# Patient Record
Sex: Male | Born: 1995 | Race: White | Hispanic: No | Marital: Single | Smoking: Never smoker
Health system: Southern US, Community
[De-identification: ages and names within clinical notes are randomized; demographics above are authoritative.]

## PROBLEM LIST (undated history)

## (undated) HISTORY — PX: TESTICLE SURGERY: SHX794

---
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
## 2015-01-23 ENCOUNTER — Emergency Department (HOSPITAL_BASED_OUTPATIENT_CLINIC_OR_DEPARTMENT_OTHER): Payer: BLUE CROSS/BLUE SHIELD

## 2015-01-23 ENCOUNTER — Emergency Department (HOSPITAL_BASED_OUTPATIENT_CLINIC_OR_DEPARTMENT_OTHER)
Admission: EM | Admit: 2015-01-23 | Discharge: 2015-01-23 | Disposition: A | Discharge status: Home / Self Care | Payer: BLUE CROSS/BLUE SHIELD | Attending: Emergency Medicine | Admitting: Emergency Medicine

## 2015-01-23 ENCOUNTER — Encounter (HOSPITAL_BASED_OUTPATIENT_CLINIC_OR_DEPARTMENT_OTHER): Payer: Self-pay | Admitting: Emergency Medicine

## 2015-01-23 DIAGNOSIS — R05 Cough: Secondary | ICD-10-CM | POA: Insufficient documentation

## 2015-01-23 DIAGNOSIS — Z880 Allergy status to penicillin: Secondary | ICD-10-CM | POA: Insufficient documentation

## 2015-01-23 DIAGNOSIS — R059 Cough, unspecified: Secondary | ICD-10-CM

## 2015-01-23 MED ORDER — PREDNISONE 50 MG PO TABS: 50.0000 mg | ORAL_TABLET | Freq: Every day | ORAL | Status: DC

## 2015-01-23 MED ORDER — ALBUTEROL SULFATE HFA 108 (90 BASE) MCG/ACT IN AERS: 2.0000 | INHALATION_SPRAY | RESPIRATORY_TRACT | Status: DC | PRN

## 2015-01-23 MED ORDER — IPRATROPIUM-ALBUTEROL 0.5-2.5 (3) MG/3ML IN SOLN
3.0000 mL | Freq: Once | RESPIRATORY_TRACT | Status: AC
  Administered 2015-01-23: 3 mL via RESPIRATORY_TRACT
  Filled 2015-01-23: qty 3

## 2015-01-23 MED ORDER — PREDNISONE 50 MG PO TABS
60.0000 mg | ORAL_TABLET | Freq: Once | ORAL | Status: AC
  Administered 2015-01-23: 60 mg via ORAL
  Filled 2015-01-23 (×2): qty 1

## 2015-01-23 NOTE — ED Notes (Signed)
Cough congestion and chest tightness from coughing for 2 weeks.

## 2015-01-23 NOTE — ED Provider Notes (Signed)
CSN: [id number]     Arrival date & time 01/23/15  0038 History   First MD Initiated Contact with Patient 01/23/15 0048     Chief Complaint  Patient presents with  . Cough     (Consider location/radiation/quality/duration/timing/severity/associated sxs/prior Treatment) Patient is a 19 y.o. male presenting with cough. The history is provided by the patient.  Cough He has had a dry cough for the last 2 weeks. This is associated with a tight feeling in his chest. This evening, he had a dull, achy pain in the right sternal area with some radiation through to the back. Denies dyspnea, fever, chills. He denies arthralgias or myalgias. He has been taking Robitussin with slight relief. There is no tobacco smoke exposure.  History reviewed. No pertinent past medical history. Past Surgical History  Procedure Laterality Date  . Testicle surgery     History reviewed. No pertinent family history. Social History  Substance Use Topics  . Smoking status: Never Smoker   . Smokeless tobacco: None  . Alcohol Use: No    Review of Systems  Respiratory: Positive for cough.   All other systems reviewed and are negative.     Allergies  Penicillins  Home Medications   Prior to Admission medications   Not on File   BP 148/77 mmHg  Pulse 82  Temp(Src) 97.7 F (36.5 C) (Oral)  Resp 20  Ht 6' (1.829 m)  Wt 222 lb (100.699 kg)  BMI 30.10 kg/m2  SpO2 99% Physical Exam  Nursing note and vitals reviewed.  19 year old male, resting comfortably and in no acute distress. Vital signs are significant for hypertension. Oxygen saturation is 99%, which is normal. Head is normocephalic and atraumatic. PERRLA, EOMI. Oropharynx is clear. Neck is nontender and supple without adenopathy or JVD. Back is nontender and there is no CVA tenderness. Lungs are clear without rales, wheezes, or rhonchi. Prolonged exhalation phase is noted. Chest is mildly tender anteriorly. Heart has regular rate and rhythm with  2/6 systolic ejection murmur heard along the left sternal border. Abdomen is soft, flat, nontender without masses or hepatosplenomegaly and peristalsis is normoactive. Extremities have no cyanosis or edema, full range of motion is present. Skin is warm and dry without rash. Neurologic: Mental status is normal, cranial nerves are intact, there are no motor or sensory deficits.  ED Course  Procedures (including critical care time)  Imaging Review Dg Chest 2 View  01/23/2015   CLINICAL DATA:  Acute onset of mid chest tightness. Initial encounter.  EXAM: CHEST  2 VIEW  COMPARISON:  None.  FINDINGS: The lungs are well-aerated and clear. There is no evidence of focal opacification, pleural effusion or pneumothorax.  The heart is normal in size; the mediastinal contour is within normal limits. No acute osseous abnormalities are seen.  IMPRESSION: No acute cardiopulmonary process seen.   Electronically Signed   By: Roanna Raider M.D.   On: 01/23/2015 01:35   I have personally reviewed and evaluated these images as part of my medical decision-making.   MDM   Final diagnoses:  Cough    Acute bronchitis which may be viral and may be allergic. Chest x-ray or be obtained in a beginning therapeutic trial of albuterol with ipratropium.  Chest x-ray is unremarkable. He had excellent relief of symptoms with albuterol with ipratropium. He is discharged with prescription for albuterol inhaler and prednisone. Initial dose of prednisone given in the ED. I suspect that his symptoms are allergic in nature. He will  need to have further evaluation for possible allergies. With his PCP.  Dione Booze, MD 01/23/15 [phone]

## 2015-01-23 NOTE — Discharge Instructions (Signed)
Cough, Adult Coughing is a reflex that clears your throat and your airways. Coughing helps to heal and protect your lungs. It is normal to cough occasionally, but a cough that happens with other symptoms or lasts a long time may be a sign of a condition that needs treatment. A cough may last only 2-3 weeks (acute), or it may last longer than 8 weeks (chronic). CAUSES Coughing is commonly caused by:  Breathing in substances that irritate your lungs.  A viral or bacterial respiratory infection.  Allergies.  Asthma.  Postnasal drip.  Smoking.  Acid backing up from the stomach into the esophagus (gastroesophageal reflux).  Certain medicines.  Chronic lung problems, including COPD (or rarely, lung cancer).  Other medical conditions such as heart failure. HOME CARE INSTRUCTIONS  Pay attention to any changes in your symptoms. Take these actions to help with your discomfort:  Take medicines only as told by your health care provider.  If you were prescribed an antibiotic medicine, take it as told by your health care provider. Do not stop taking the antibiotic even if you start to feel better.  Talk with your health care provider before you take a cough suppressant medicine.  Drink enough fluid to keep your urine clear or pale yellow.  If the air is dry, use a cold steam vaporizer or humidifier in your bedroom or your home to help loosen secretions.  Avoid anything that causes you to cough at work or at home.  If your cough is worse at night, try sleeping in a semi-upright position.  Avoid cigarette smoke. If you smoke, quit smoking. If you need help quitting, ask your health care provider.  Avoid caffeine.  Avoid alcohol.  Rest as needed. SEEK MEDICAL CARE IF:   You have new symptoms.  You cough up pus.  Your cough does not get better after 2-3 weeks, or your cough gets worse.  You cannot control your cough with suppressant medicines and you are losing sleep.  You  develop pain that is getting worse or pain that is not controlled with pain medicines.  You have a fever.  You have unexplained weight loss.  You have night sweats. SEEK IMMEDIATE MEDICAL CARE IF:  You cough up blood.  You have difficulty breathing.  Your heartbeat is very fast.   This information is not intended to replace advice given to you by your health care provider. Make sure you discuss any questions you have with your health care provider.   Document Released: 10/01/2010 Document Revised: 12/24/2014 Document Reviewed: 06/11/2014 Elsevier Interactive Patient Education 2016 [employer].   Allergies An allergy is an abnormal reaction to a substance by the body's defense system (immune system). Allergies can develop at any age. WHAT CAUSES ALLERGIES? An allergic reaction happens when the immune system mistakenly reacts to a normally harmless substance, called an allergen, as if it were harmful. The immune system releases antibodies to fight the substance. Antibodies eventually release a chemical called histamine into the bloodstream. The release of histamine is meant to protect the body from infection, but it also causes discomfort. An allergic reaction can be triggered by:  Eating an allergen.  Inhaling an allergen.  Touching an allergen. WHAT TYPES OF ALLERGIES ARE THERE? There are many types of allergies. Common types include:  Seasonal allergies. People with this type of allergy are usually allergic to substances that are only present during certain seasons, such as molds and pollens.  Food allergies.  Drug allergies.  Insect allergies.  Animal dander allergies. WHAT ARE SYMPTOMS OF ALLERGIES? Possible allergy symptoms include:  Swelling of the lips, face, tongue, mouth, or throat.  Sneezing, coughing, or wheezing.  Nasal congestion.  Tingling in the mouth.  Rash.  Itching.  Itchy, red, swollen areas of skin (hives).  Watery  eyes.  Vomiting.  Diarrhea.  Dizziness.  Lightheadedness.  Fainting.  Trouble breathing or swallowing.  Chest tightness.  Rapid heartbeat. HOW ARE ALLERGIES DIAGNOSED? Allergies are diagnosed with a medical and family history and one or more of the following:  Skin tests.  Blood tests.  A food diary. A food diary is a record of all the foods and drinks you have in a day and of all the symptoms you experience.  The results of an elimination diet. An elimination diet involves eliminating foods from your diet and then adding them back in one by one to find out if a certain food causes an allergic reaction. HOW ARE ALLERGIES TREATED? There is no cure for allergies, but allergic reactions can be treated with medicine. Severe reactions usually need to be treated at a hospital. HOW CAN REACTIONS BE PREVENTED? The best way to prevent an allergic reaction is by avoiding the substance you are allergic to. Allergy shots and medicines can also help prevent reactions in some cases. People with severe allergic reactions may be able to prevent a life-threatening reaction called anaphylaxis with a medicine given right after exposure to the allergen.   This information is not intended to replace advice given to you by your health care provider. Make sure you discuss any questions you have with your health care provider.   Document Released: 06/28/2002 Document Revised: 04/25/2014 Document Reviewed: 01/14/2014 Elsevier Interactive Patient Education 2016 Elsevier Inc.  Albuterol inhalation aerosol What is this medicine? ALBUTEROL (al Gaspar Bidding) is a bronchodilator. It helps open up the airways in your lungs to make it easier to breathe. This medicine is used to treat and to prevent bronchospasm. This medicine may be used for other purposes; ask your health care provider or pharmacist if you have questions. What should I tell my health care provider before I take this medicine? They need to  know if you have any of the following conditions: -diabetes -heart disease or irregular heartbeat -high blood pressure -pheochromocytoma -seizures -thyroid disease -an unusual or allergic reaction to albuterol, levalbuterol, sulfites, other medicines, foods, dyes, or preservatives -pregnant or trying to get pregnant -breast-feeding How should I use this medicine? This medicine is for inhalation through the mouth. Follow the directions on your prescription label. Take your medicine at regular intervals. Do not use more often than directed. Make sure that you are using your inhaler correctly. Ask you doctor or health care provider if you have any questions. Talk to your pediatrician regarding the use of this medicine in children. Special care may be needed. Overdosage: If you think you have taken too much of this medicine contact a poison control center or emergency room at once. NOTE: This medicine is only for you. Do not share this medicine with others. What if I miss a dose? If you miss a dose, use it as soon as you can. If it is almost time for your next dose, use only that dose. Do not use double or extra doses. What may interact with this medicine? -anti-infectives like chloroquine and pentamidine -caffeine -cisapride -diuretics -medicines for colds -medicines for depression or for emotional or psychotic conditions -medicines for weight loss including some herbal products -methadone -  some antibiotics like clarithromycin, erythromycin, levofloxacin, and linezolid -some heart medicines -steroid hormones like dexamethasone, cortisone, hydrocortisone -theophylline -thyroid hormones This list may not describe all possible interactions. Give your health care provider a list of all the medicines, herbs, non-prescription drugs, or dietary supplements you use. Also tell them if you smoke, drink alcohol, or use illegal drugs. Some items may interact with your medicine. What should I watch  for while using this medicine? Tell your doctor or health care professional if your symptoms do not improve. Do not use extra albuterol. If your asthma or bronchitis gets worse while you are using this medicine, call your doctor right away. If your mouth gets dry try chewing sugarless gum or sucking hard candy. Drink water as directed. What side effects may I notice from receiving this medicine? Side effects that you should report to your doctor or health care professional as soon as possible: -allergic reactions like skin rash, itching or hives, swelling of the face, lips, or tongue -breathing problems -chest pain -feeling faint or lightheaded, falls -high blood pressure -irregular heartbeat -fever -muscle cramps or weakness -pain, tingling, numbness in the hands or feet -vomiting Side effects that usually do not require medical attention (report to your doctor or health care professional if they continue or are bothersome): -cough -difficulty sleeping -headache -nervousness or trembling -stomach upset -stuffy or runny nose -throat irritation -unusual taste This list may not describe all possible side effects. Call your doctor for medical advice about side effects. You may report side effects to FDA at 1-800-FDA-1088. Where should I keep my medicine? Keep out of the reach of children. Store at room temperature between 15 and 30 degrees C (59 and 86 degrees F). The contents are under pressure and may burst when exposed to heat or flame. Do not freeze. This medicine does not work as well if it is too cold. Throw away any unused medicine after the expiration date. Inhalers need to be thrown away after the labeled number of puffs have been used or by the expiration date; whichever comes first. Ventolin HFA should be thrown away 12 months after removing from foil pouch. Check the instructions that come with your medicine. NOTE: This sheet is a summary. It may not cover all possible  information. If you have questions about this medicine, talk to your doctor, pharmacist, or health care provider.    2016, Elsevier/Gold Standard. (2012-09-20 10:57:17)  Prednisone tablets What is this medicine? PREDNISONE (PRED ni sone) is a corticosteroid. It is commonly used to treat inflammation of the skin, joints, lungs, and other organs. Common conditions treated include asthma, allergies, and arthritis. It is also used for other conditions, such as blood disorders and diseases of the adrenal glands. This medicine may be used for other purposes; ask your health care provider or pharmacist if you have questions. What should I tell my health care provider before I take this medicine? They need to know if you have any of these conditions: -Cushing's syndrome -diabetes -glaucoma -heart disease -high blood pressure -infection (especially a virus infection such as chickenpox, cold sores, or herpes) -kidney disease -liver disease -mental illness -myasthenia gravis -osteoporosis -seizures -stomach or intestine problems -thyroid disease -an unusual or allergic reaction to lactose, prednisone, other medicines, foods, dyes, or preservatives -pregnant or trying to get pregnant -breast-feeding How should I use this medicine? Take this medicine by mouth with a glass of water. Follow the directions on the prescription label. Take this medicine with food. If you are  taking this medicine once a day, take it in the morning. Do not take more medicine than you are told to take. Do not suddenly stop taking your medicine because you may develop a severe reaction. Your doctor will tell you how much medicine to take. If your doctor wants you to stop the medicine, the dose may be slowly lowered over time to avoid any side effects. Talk to your pediatrician regarding the use of this medicine in children. Special care may be needed. Overdosage: If you think you have taken too much of this medicine contact  a poison control center or emergency room at once. NOTE: This medicine is only for you. Do not share this medicine with others. What if I miss a dose? If you miss a dose, take it as soon as you can. If it is almost time for your next dose, talk to your doctor or health care professional. You may need to miss a dose or take an extra dose. Do not take double or extra doses without advice. What may interact with this medicine? Do not take this medicine with any of the following medications: -metyrapone -mifepristone This medicine may also interact with the following medications: -aminoglutethimide -amphotericin B -aspirin and aspirin-like medicines -barbiturates -certain medicines for diabetes, like glipizide or glyburide -cholestyramine -cholinesterase inhibitors -cyclosporine -digoxin -diuretics -ephedrine -male hormones, like estrogens and birth control pills -isoniazid -ketoconazole -NSAIDS, medicines for pain and inflammation, like ibuprofen or naproxen -phenytoin -rifampin -toxoids -vaccines -warfarin This list may not describe all possible interactions. Give your health care provider a list of all the medicines, herbs, non-prescription drugs, or dietary supplements you use. Also tell them if you smoke, drink alcohol, or use illegal drugs. Some items may interact with your medicine. What should I watch for while using this medicine? Visit your doctor or health care professional for regular checks on your progress. If you are taking this medicine over a prolonged period, carry an identification card with your name and address, the type and dose of your medicine, and your doctor's name and address. This medicine may increase your risk of getting an infection. Tell your doctor or health care professional if you are around anyone with measles or chickenpox, or if you develop sores or blisters that do not heal properly. If you are going to have surgery, tell your doctor or health  care professional that you have taken this medicine within the last twelve months. Ask your doctor or health care professional about your diet. You may need to lower the amount of salt you eat. This medicine may affect blood sugar levels. If you have diabetes, check with your doctor or health care professional before you change your diet or the dose of your diabetic medicine. What side effects may I notice from receiving this medicine? Side effects that you should report to your doctor or health care professional as soon as possible: -allergic reactions like skin rash, itching or hives, swelling of the face, lips, or tongue -changes in emotions or moods -changes in vision -depressed mood -eye pain -fever or chills, cough, sore throat, pain or difficulty passing urine -increased thirst -swelling of ankles, feet Side effects that usually do not require medical attention (report to your doctor or health care professional if they continue or are bothersome): -confusion, excitement, restlessness -headache -nausea, vomiting -skin problems, acne, thin and shiny skin -trouble sleeping -weight gain This list may not describe all possible side effects. Call your doctor for medical advice about side effects. You  may report side effects to FDA at 1-800-FDA-1088. Where should I keep my medicine? Keep out of the reach of children. Store at room temperature between 15 and 30 degrees C (59 and 86 degrees F). Protect from light. Keep container tightly closed. Throw away any unused medicine after the expiration date. NOTE: This sheet is a summary. It may not cover all possible information. If you have questions about this medicine, talk to your doctor, pharmacist, or health care provider.    2016, Elsevier/Gold Standard. (2010-11-18 10:57:14)

## 2018-11-01 ENCOUNTER — Emergency Department (HOSPITAL_BASED_OUTPATIENT_CLINIC_OR_DEPARTMENT_OTHER)
Admission: EM | Admit: 2018-11-01 | Discharge: 2018-11-01 | Disposition: A | Discharge status: Home / Self Care | Payer: Federal, State, Local not specified - PPO | Attending: Emergency Medicine | Admitting: Emergency Medicine

## 2018-11-01 ENCOUNTER — Other Ambulatory Visit: Payer: Self-pay

## 2018-11-01 ENCOUNTER — Encounter (HOSPITAL_BASED_OUTPATIENT_CLINIC_OR_DEPARTMENT_OTHER): Payer: Self-pay | Admitting: Emergency Medicine

## 2018-11-01 DIAGNOSIS — Z20828 Contact with and (suspected) exposure to other viral communicable diseases: Secondary | ICD-10-CM | POA: Insufficient documentation

## 2018-11-01 DIAGNOSIS — R1032 Left lower quadrant pain: Secondary | ICD-10-CM | POA: Insufficient documentation

## 2018-11-01 DIAGNOSIS — R109 Unspecified abdominal pain: Secondary | ICD-10-CM | POA: Diagnosis present

## 2018-11-01 DIAGNOSIS — R1012 Left upper quadrant pain: Secondary | ICD-10-CM | POA: Diagnosis not present

## 2018-11-01 LAB — CBC WITH DIFFERENTIAL/PLATELET
Abs Immature Granulocytes: 0.02 10*3/uL (ref 0.00–0.07)
Basophils Absolute: 0.1 10*3/uL (ref 0.0–0.1)
Basophils Relative: 1 %
Eosinophils Absolute: 0.1 10*3/uL (ref 0.0–0.5)
Eosinophils Relative: 2 %
HCT: 39.3 % (ref 39.0–52.0)
Hemoglobin: 13.2 g/dL (ref 13.0–17.0)
Immature Granulocytes: 0 %
Lymphocytes Relative: 28 %
Lymphs Abs: 1.9 10*3/uL (ref 0.7–4.0)
MCH: 30.2 pg (ref 26.0–34.0)
MCHC: 33.6 g/dL (ref 30.0–36.0)
MCV: 89.9 fL (ref 80.0–100.0)
Monocytes Absolute: 0.6 10*3/uL (ref 0.1–1.0)
Monocytes Relative: 9 %
Neutro Abs: 3.9 10*3/uL (ref 1.7–7.7)
Neutrophils Relative %: 60 %
Platelets: 288 10*3/uL (ref 150–400)
RBC: 4.37 MIL/uL (ref 4.22–5.81)
RDW: 12.1 % (ref 11.5–15.5)
WBC: 6.6 10*3/uL (ref 4.0–10.5)
nRBC: 0 % (ref 0.0–0.2)

## 2018-11-01 LAB — URINALYSIS, MICROSCOPIC (REFLEX)

## 2018-11-01 LAB — COMPREHENSIVE METABOLIC PANEL
ALT: 11 U/L (ref 0–44)
AST: 16 U/L (ref 15–41)
Albumin: 3.8 g/dL (ref 3.5–5.0)
Alkaline Phosphatase: 44 U/L (ref 38–126)
Anion gap: 8 (ref 5–15)
BUN: 15 mg/dL (ref 6–20)
CO2: 26 mmol/L (ref 22–32)
Calcium: 8.3 mg/dL — ABNORMAL LOW (ref 8.9–10.3)
Chloride: 106 mmol/L (ref 98–111)
Creatinine, Ser: 0.85 mg/dL (ref 0.61–1.24)
GFR calc Af Amer: >60 mL/min (ref >60)
GFR calc non Af Amer: >60 mL/min (ref >60)
Glucose, Bld: 102 mg/dL — ABNORMAL HIGH (ref 70–99)
Potassium: 3.4 mmol/L — ABNORMAL LOW (ref 3.5–5.1)
Sodium: 140 mmol/L (ref 135–145)
Total Bilirubin: 0.6 mg/dL (ref 0.3–1.2)
Total Protein: 6.5 g/dL (ref 6.5–8.1)

## 2018-11-01 LAB — URINALYSIS, ROUTINE W REFLEX MICROSCOPIC
Bilirubin Urine: NEGATIVE
Glucose, UA: NEGATIVE mg/dL
Ketones, ur: NEGATIVE mg/dL
Leukocytes,Ua: NEGATIVE
Nitrite: NEGATIVE
Protein, ur: NEGATIVE mg/dL
Specific Gravity, Urine: >1.03 — ABNORMAL HIGH (ref 1.005–1.030)
pH: 6 (ref 5.0–8.0)

## 2018-11-01 LAB — LIPASE, BLOOD: Lipase: 25 U/L (ref 11–51)

## 2018-11-01 MED ORDER — LIDOCAINE VISCOUS HCL 2 % MT SOLN
15.0000 mL | Freq: Once | OROMUCOSAL | Status: AC
  Administered 2018-11-01: 15 mL via ORAL
  Filled 2018-11-01: qty 15

## 2018-11-01 MED ORDER — POTASSIUM CHLORIDE CRYS ER 20 MEQ PO TBCR
40.0000 meq | EXTENDED_RELEASE_TABLET | Freq: Once | ORAL | Status: AC
  Administered 2018-11-01: 40 meq via ORAL
  Filled 2018-11-01: qty 2

## 2018-11-01 MED ORDER — FAMOTIDINE 20 MG PO TABS: 20.0000 mg | ORAL_TABLET | Freq: Two times a day (BID) | ORAL | 0 refills | Status: DC

## 2018-11-01 MED ORDER — PANTOPRAZOLE SODIUM 40 MG PO TBEC: 40.0000 mg | DELAYED_RELEASE_TABLET | Freq: Every day | ORAL | 0 refills | Status: AC

## 2018-11-01 MED ORDER — ALUM & MAG HYDROXIDE-SIMETH 200-200-20 MG/5ML PO SUSP
30.0000 mL | Freq: Once | ORAL | Status: AC
  Administered 2018-11-01: 30 mL via ORAL
  Filled 2018-11-01: qty 30

## 2018-11-01 NOTE — ED Notes (Signed)
ED Provider at bedside. 

## 2018-11-01 NOTE — ED Provider Notes (Signed)
[facility] EMERGENCY DEPARTMENT Provider Note   CSN: [id number] Arrival date & time: 11/01/18  [id number]    History   Chief Complaint Chief Complaint  Patient presents with   Abdominal Pain    HPI Mario Cruz is a 23 y.o. male.     HPI  23 year old male with no significant past medical history presents with left-sided abdominal pain.  It has been present for about 2-4 weeks.  Worse over the last 1 week.  He noticed the pain was worse about 45 minutes after eating dinner last night.  Otherwise he has not been paying attention to know whether or not it is worse after eating typically.  It was at first coming and going but now is more constant.  Pepto-Bismol seems to temporarily help.  No vomiting, and constipation, or urinary symptoms.  He has noticed some diarrhea.  Pain currently is about a 4 out of 10.  History reviewed. No pertinent past medical history.  There are no active problems to display for this patient.   Past Surgical History:  Procedure Laterality Date   TESTICLE SURGERY          Home Medications    Prior to Admission medications   Medication Sig Start Date End Date Taking? Authorizing Provider  albuterol (PROVENTIL HFA;VENTOLIN HFA) 108 (90 BASE) MCG/ACT inhaler Inhale 2 puffs into the lungs every 4 (four) hours as needed for wheezing or shortness of breath (or coughing). 01/23/15   Dione BoozeGlick, David, MD  famotidine (PEPCID) 20 MG tablet Take 1 tablet (20 mg total) by mouth 2 (two) times daily. 11/01/18   Pricilla LovelessGoldston, Corvin Sorbo, MD  pantoprazole (PROTONIX) 40 MG tablet Take 1 tablet (40 mg total) by mouth daily. 11/01/18   Pricilla LovelessGoldston, Elzy Tomasello, MD  predniSONE (DELTASONE) 50 MG tablet Take 1 tablet (50 mg total) by mouth daily. 01/23/15   Dione BoozeGlick, David, MD    Family History History reviewed. No pertinent family history.  Social History Social History   Tobacco Use   Smoking status: Never Smoker   Smokeless tobacco: Never Used  Substance Use Topics    Alcohol use: No   Drug use: Not on file     Allergies   Penicillins   Review of Systems Review of Systems  Constitutional: Negative for fever.  Gastrointestinal: Positive for abdominal pain, diarrhea and nausea. Negative for vomiting.  Genitourinary: Negative for dysuria and hematuria.  All other systems reviewed and are negative.    Physical Exam Updated Vital Signs BP 129/81 (BP Location: Right Arm)    Pulse 66    Temp 98.1 F (36.7 C) (Oral)    Resp 18    Ht 6' (1.829 m)    Wt 77.1 kg    SpO2 100%    BMI 23.06 kg/m   Physical Exam Vitals signs and nursing note reviewed.  Constitutional:      General: He is not in acute distress.    Appearance: He is well-developed. He is not ill-appearing or diaphoretic.  HENT:     Head: Normocephalic and atraumatic.     Right Ear: External ear normal.     Left Ear: External ear normal.     Nose: Nose normal.  Eyes:     General:        Right eye: No discharge.        Left eye: No discharge.  Neck:     Musculoskeletal: Neck supple.  Cardiovascular:     Rate and Rhythm: Normal rate and regular rhythm.  Heart sounds: Normal heart sounds.  Pulmonary:     Effort: Pulmonary effort is normal.     Breath sounds: Normal breath sounds.  Abdominal:     Palpations: Abdomen is soft.     Tenderness: There is abdominal tenderness (mild) in the left upper quadrant.  Skin:    General: Skin is warm and dry.  Neurological:     Mental Status: He is alert.  Psychiatric:        Mood and Affect: Mood is not anxious.      ED Treatments / Results  Labs (all labs ordered are listed, but only abnormal results are displayed) Labs Reviewed  COMPREHENSIVE METABOLIC PANEL - Abnormal; Notable for the following components:      Result Value   Potassium 3.4 (*)    Glucose, Bld 102 (*)    Calcium 8.3 (*)    All other components within normal limits  URINALYSIS, ROUTINE W REFLEX MICROSCOPIC - Abnormal; Notable for the following components:    Specific Gravity, Urine >1.030 (*)    Hgb urine dipstick TRACE (*)    All other components within normal limits  URINALYSIS, MICROSCOPIC (REFLEX) - Abnormal; Notable for the following components:   Bacteria, UA FEW (*)    All other components within normal limits  NOVEL CORONAVIRUS, NAA (HOSPITAL ORDER, SEND-OUT TO REF LAB)  CBC WITH DIFFERENTIAL/PLATELET  LIPASE, BLOOD    EKG None  Radiology No results found.  Procedures Procedures (including critical care time)  Medications Ordered in ED Medications  potassium chloride SA (K-DUR) CR tablet 40 mEq (has no administration in time range)  alum & mag hydroxide-simeth (MAALOX/MYLANTA) 200-200-20 MG/5ML suspension 30 mL (30 mLs Oral Given 11/01/18 0739)    And  lidocaine (XYLOCAINE) 2 % viscous mouth solution 15 mL (15 mLs Oral Given 11/01/18 0739)     Initial Impression / Assessment and Plan / ED Course  I have reviewed the triage vital signs and the nursing notes.  Pertinent labs & imaging results that were available during my care of the patient were reviewed by me and considered in my medical decision making (see chart for details).        Patient has minimal to no abdominal tenderness on exam.  He states there is pain in his left upper quadrant but sometimes pain in his left lower quadrant.  No flank or back pain.  Screening lab work is pretty benign.  There is trace hemoglobin on the urinalysis but no RBCs and is hard to know if this is true or significant.  I offered to do CT but through shared decision making we decided to hold off given that it is unlikely to immediately change management and he is unlikely to have a significant, emergent condition.  I still think this is probably upper GI such as gastritis.  We discussed we will try med such as PPI and H2 blocker and if these are not helping or if at any point he gets worse he should return here and we may pursue more advanced imaging.  Otherwise, we discussed return  precautions  He is also requesting a test for the novel coronavirus given a coworker had it and is not sure if he was exposed.  I discussed the limitations of this type of testing given he is asymptomatic but will send an outpatient test.  Nasir Bright was evaluated in Emergency Department on 11/01/2018 for the symptoms described in the history of present illness. He was evaluated in the context  of the global COVID-19 pandemic, which necessitated consideration that the patient might be at risk for infection with the SARS-CoV-2 virus that causes COVID-19. Institutional protocols and algorithms that pertain to the evaluation of patients at risk for COVID-19 are in a state of rapid change based on information released by regulatory bodies including the CDC and federal and state organizations. These policies and algorithms were followed during the patient's care in the ED.   Final Clinical Impressions(s) / ED Diagnoses   Final diagnoses:  Left sided abdominal pain    ED Discharge Orders         Ordered    pantoprazole (PROTONIX) 40 MG tablet  Daily     11/01/18 0810    famotidine (PEPCID) 20 MG tablet  2 times daily     11/01/18 0810           Pricilla LovelessGoldston, Musette Kisamore, MD 11/01/18 [phone]

## 2018-11-01 NOTE — ED Triage Notes (Signed)
Patient presents with complaints of left upper quadrant pain with nausea; states intermittent x 2-4 weeks; states taking pepto otc at home.

## 2018-11-01 NOTE — Discharge Instructions (Addendum)
If you develop worsening, continued, or recurrent abdominal pain, uncontrolled vomiting, fever, chest or back pain, or any other new/concerning symptoms then return to the ER for evaluation.   You most likely have stomach acid irritation or an ulcer.  Avoid things such as spicy food, alcohol, ibuprofen or other NSAIDs.  Return immediately if you develop vomiting or especially vomiting with blood.

## 2018-11-02 LAB — NOVEL CORONAVIRUS, NAA (HOSP ORDER, SEND-OUT TO REF LAB; TAT 18-24 HRS): SARS-CoV-2, NAA: NOT DETECTED

## 2020-07-29 ENCOUNTER — Other Ambulatory Visit: Payer: Self-pay

## 2020-07-29 ENCOUNTER — Emergency Department (INDEPENDENT_AMBULATORY_CARE_PROVIDER_SITE_OTHER): Payer: BC Managed Care – PPO

## 2020-07-29 ENCOUNTER — Emergency Department (INDEPENDENT_AMBULATORY_CARE_PROVIDER_SITE_OTHER)
Admission: RE | Admit: 2020-07-29 | Discharge: 2020-07-29 | Disposition: A | Discharge status: Home / Self Care | Payer: BC Managed Care – PPO | Source: Ambulatory Visit | Attending: Family Medicine | Admitting: Family Medicine

## 2020-07-29 VITALS — BP 138/81 | HR 72 | Temp 98.6°F | Resp 16

## 2020-07-29 DIAGNOSIS — R059 Cough, unspecified: Secondary | ICD-10-CM | POA: Diagnosis not present

## 2020-07-29 DIAGNOSIS — R058 Other specified cough: Secondary | ICD-10-CM | POA: Diagnosis not present

## 2020-07-29 MED ORDER — BENZONATATE 200 MG PO CAPS: 200.0000 mg | ORAL_CAPSULE | Freq: Two times a day (BID) | ORAL | 0 refills | Status: AC | PRN

## 2020-07-29 MED ORDER — PREDNISONE 20 MG PO TABS: 20.0000 mg | ORAL_TABLET | Freq: Two times a day (BID) | ORAL | 0 refills | Status: AC

## 2020-07-29 NOTE — ED Provider Notes (Signed)
[facility]    CSN: [id number] Arrival date & time: 07/29/20  1145      History   Chief Complaint Chief Complaint  Patient presents with  . Appointment    12:00  . Cough    HPI Mario Cruz is a 25 y.o. male.   HPI  Patient is here for chronic cough.  To dry cough.  Is been present for about 4 weeks.  He saw his primary care doctor.  He was given medicine for GERD.  He was given medicine for allergies.  Currently taking pantoprazole and Pepcid,, Zyrtec, and Flonase.  He states he tried these for 5 days.  He states that he has not gotten better.  He did not have any illness to start with, he does not know that he had any Covid or bronchitis.  He has had no fever or chills.  No underlying asthma or lung disease.  History reviewed. No pertinent past medical history.  There are no problems to display for this patient.   Past Surgical History:  Procedure Laterality Date  . TESTICLE SURGERY         Home Medications    Prior to Admission medications   Medication Sig Start Date End Date Taking? Authorizing Provider  benzonatate (TESSALON) 200 MG capsule Take 1 capsule (200 mg total) by mouth 2 (two) times daily as needed for cough. 07/29/20  Yes Eustace Moore, MD  predniSONE (DELTASONE) 20 MG tablet Take 1 tablet (20 mg total) by mouth 2 (two) times daily with a meal. 07/29/20  Yes Eustace Moore, MD  pantoprazole (PROTONIX) 40 MG tablet Take 1 tablet (40 mg total) by mouth daily. 11/01/18   Pricilla Loveless, MD  albuterol (PROVENTIL HFA;VENTOLIN HFA) 108 (90 BASE) MCG/ACT inhaler Inhale 2 puffs into the lungs every 4 (four) hours as needed for wheezing or shortness of breath (or coughing). 01/23/15 07/29/20  Dione Booze, MD  famotidine (PEPCID) 20 MG tablet Take 1 tablet (20 mg total) by mouth 2 (two) times daily. 11/01/18 07/29/20  Pricilla Loveless, MD    Family History History reviewed. No pertinent family history.  Social History Social History    Tobacco Use  . Smoking status: Never Smoker  . Smokeless tobacco: Never Used  Substance Use Topics  . Alcohol use: No     Allergies   Penicillins   Review of Systems Review of Systems See HPI  Physical Exam Triage Vital Signs ED Triage Vitals  Enc Vitals Group     BP 07/29/20 1202 138/81     Pulse Rate 07/29/20 1202 72     Resp 07/29/20 1202 16     Temp 07/29/20 1202 98.6 F (37 C)     Temp Source 07/29/20 1202 Oral     SpO2 07/29/20 1202 92 %     Weight --      Height --      Head Circumference --      Peak Flow --      Pain Score 07/29/20 1200 4     Pain Loc --      Pain Edu? --      Excl. in GC? --    No data found.  Updated Vital Signs BP 138/81 (BP Location: Left Arm)   Pulse 72   Temp 98.6 F (37 C) (Oral)   Resp 16   SpO2 92%     Physical Exam Constitutional:      General: He is not in acute distress.  Appearance: He is well-developed.  HENT:     Head: Normocephalic and atraumatic.     Right Ear: Tympanic membrane and ear canal normal.     Left Ear: Tympanic membrane and ear canal normal.     Nose: Rhinorrhea present.     Mouth/Throat:     Mouth: Mucous membranes are dry.     Pharynx: No posterior oropharyngeal erythema.  Eyes:     Conjunctiva/sclera: Conjunctivae normal.     Pupils: Pupils are equal, round, and reactive to light.  Cardiovascular:     Rate and Rhythm: Normal rate and regular rhythm.     Heart sounds: Normal heart sounds.  Pulmonary:     Effort: Pulmonary effort is normal. No respiratory distress.     Breath sounds: Normal breath sounds.  Abdominal:     General: There is no distension.     Palpations: Abdomen is soft.  Musculoskeletal:        General: Normal range of motion.     Cervical back: Normal range of motion.  Skin:    General: Skin is warm and dry.  Neurological:     Mental Status: He is alert.  Psychiatric:        Behavior: Behavior normal.      UC Treatments / Results  Labs (all labs ordered  are listed, but only abnormal results are displayed) Labs Reviewed - No data to display  EKG   Radiology DG Chest 2 View  Result Date: 07/29/2020 CLINICAL DATA:  Dry cough for 3-4 weeks. EXAM: CHEST - 2 VIEW COMPARISON:  PA and lateral chest 01/23/2015. FINDINGS: Lungs clear. Heart size normal. No pneumothorax or pleural fluid. No bony abnormality. IMPRESSION: Normal chest. Electronically Signed   By: Drusilla Kanner M.D.   On: 07/29/2020 13:13    Procedures Procedures (including critical care time)  Medications Ordered in UC Medications - No data to display  Initial Impression / Assessment and Plan / UC Course  I have reviewed the triage vital signs and the nursing notes.  Pertinent labs & imaging results that were available during my care of the patient were reviewed by me and considered in my medical decision making (see chart for details).     See HPI Final Clinical Impressions(s) / UC Diagnoses   Final diagnoses:  None     Discharge Instructions     Your chest x-ray is normal I am prescribing medication to help with the cough.  First, take prednisone 2 times a day for 5 days.  This will help with any inflammation in your lungs.  Take your first dose when you get the prescription, and your second dose with supper.  After this take twice a day with food Second.  Take Tessalon 2 times a day.  This will help reduce the coughing spells I think that you may just need more time for the allergy medicine and the acid reflux medicine to work See your primary care doctor if not improving in 2 weeks   ED Prescriptions    Medication Sig Dispense Auth. Provider   predniSONE (DELTASONE) 20 MG tablet Take 1 tablet (20 mg total) by mouth 2 (two) times daily with a meal. 10 tablet Eustace Moore, MD   benzonatate (TESSALON) 200 MG capsule Take 1 capsule (200 mg total) by mouth 2 (two) times daily as needed for cough. 20 capsule Eustace Moore, MD     PDMP not reviewed this  encounter.   Eustace Moore, MD 07/29/20  1421  

## 2020-07-29 NOTE — Discharge Instructions (Addendum)
Your chest x-ray is normal I am prescribing medication to help with the cough.  First, take prednisone 2 times a day for 5 days.  This will help with any inflammation in your lungs.  Take your first dose when you get the prescription, and your second dose with supper.  After this take twice a day with food Second.  Take Tessalon 2 times a day.  This will help reduce the coughing spells I think that you may just need more time for the allergy medicine and the acid reflux medicine to work See your primary care doctor if not improving in 2 weeks

## 2020-07-29 NOTE — ED Triage Notes (Signed)
Patient presents to Urgent Care with complaints of cough since 3-4 weeks. Patient reports persistent cough, tickle of throat. Cough is worst at night. Taking Robitussin, cough drops.

## 2021-10-02 IMAGING — DX DG CHEST 2V
2 series · 2 of 2 positions shown · non-contrast
Comparison: PA and lateral chest 01/23/2015.

CLINICAL DATA: Dry cough for 3-4 weeks.

EXAM:
CHEST - 2 VIEW

[chest pa]
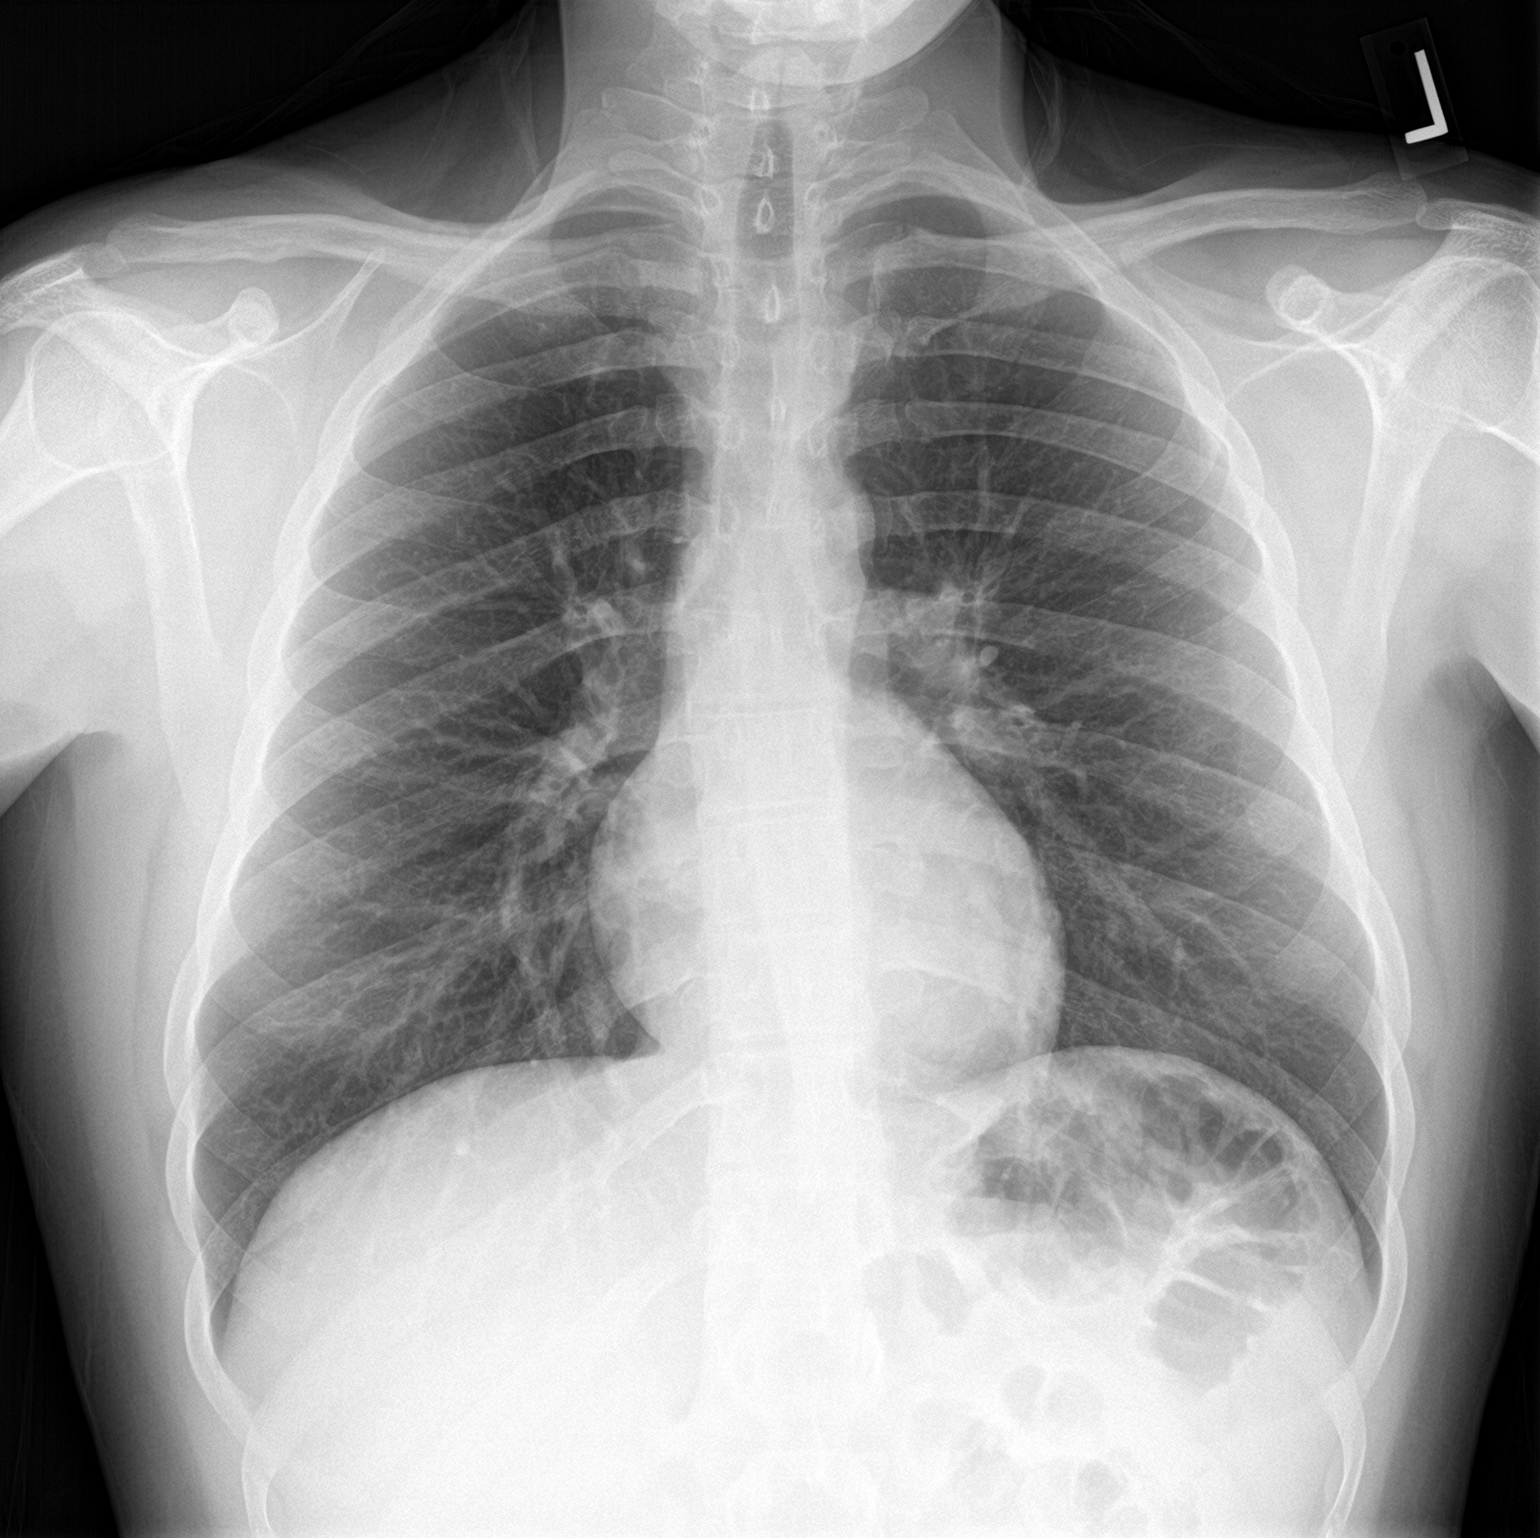

[chest lat]
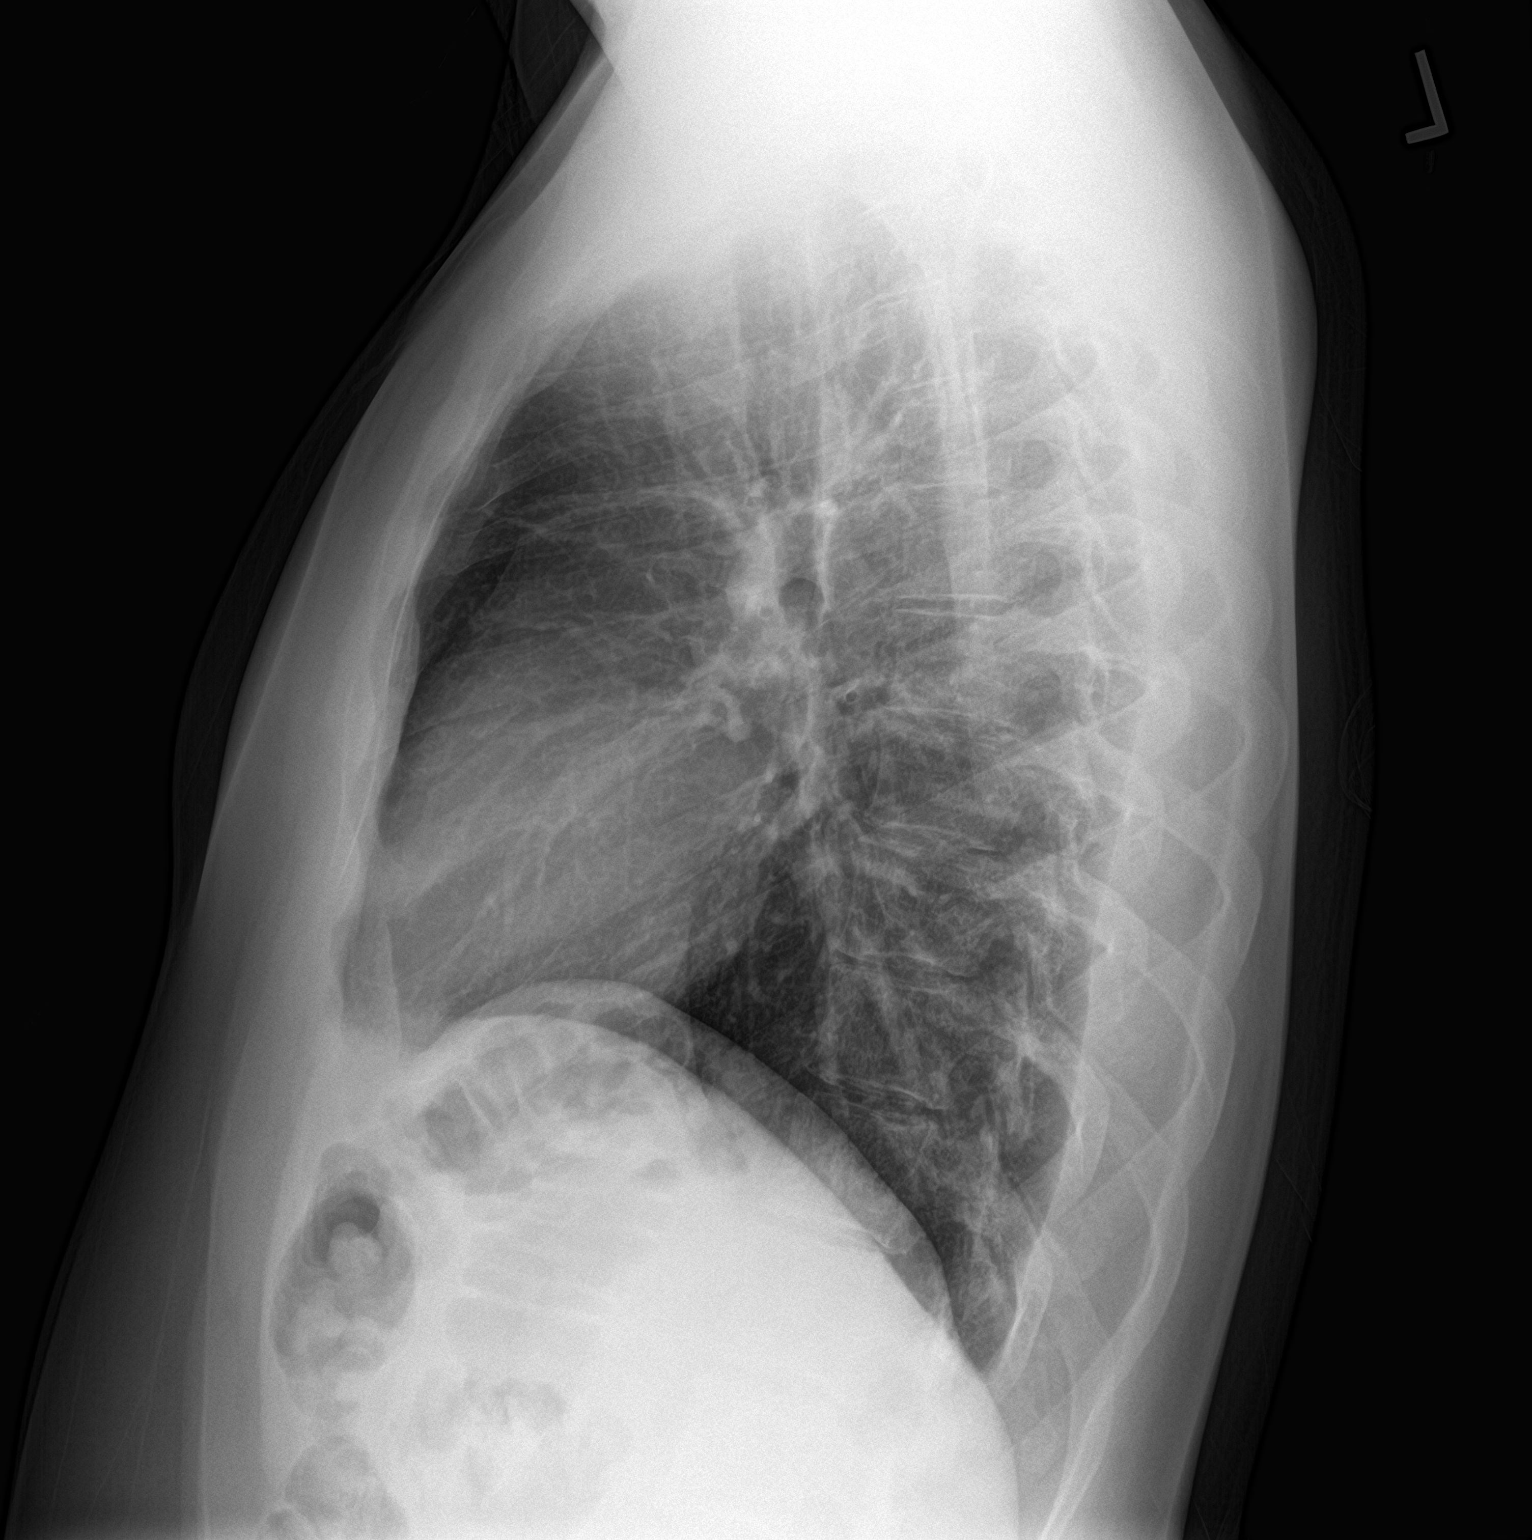

[2 of 2 positions shown; findings below may reference images not displayed]

FINDINGS: Lungs clear. Heart size normal. No pneumothorax or pleural fluid. No
bony abnormality.
IMPRESSION: Normal chest.
